# Patient Record
Sex: Male | Born: 1985 | Race: Black or African American | Hispanic: No | Marital: Single
Health system: Southern US, Community
[De-identification: ages and names within clinical notes are randomized; demographics above are authoritative.]

---
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
## 2009-01-19 ENCOUNTER — Emergency Department: Payer: Self-pay | Admitting: Internal Medicine

## 2021-01-03 ENCOUNTER — Emergency Department: Payer: Self-pay

## 2021-01-03 ENCOUNTER — Emergency Department
Admission: EM | Admit: 2021-01-03 | Discharge: 2021-01-03 | Disposition: A | Discharge status: Home / Self Care | Payer: Self-pay | Attending: Emergency Medicine | Admitting: Emergency Medicine

## 2021-01-03 ENCOUNTER — Other Ambulatory Visit: Payer: Self-pay

## 2021-01-03 DIAGNOSIS — R200 Anesthesia of skin: Secondary | ICD-10-CM | POA: Insufficient documentation

## 2021-01-03 DIAGNOSIS — S62102A Fracture of unspecified carpal bone, left wrist, initial encounter for closed fracture: Secondary | ICD-10-CM

## 2021-01-03 DIAGNOSIS — W19XXXA Unspecified fall, initial encounter: Secondary | ICD-10-CM | POA: Insufficient documentation

## 2021-01-03 DIAGNOSIS — M533 Sacrococcygeal disorders, not elsewhere classified: Secondary | ICD-10-CM | POA: Insufficient documentation

## 2021-01-03 DIAGNOSIS — S52502A Unspecified fracture of the lower end of left radius, initial encounter for closed fracture: Secondary | ICD-10-CM | POA: Insufficient documentation

## 2021-01-03 DIAGNOSIS — Y9367 Activity, basketball: Secondary | ICD-10-CM | POA: Insufficient documentation

## 2021-01-03 MED ORDER — KETAMINE HCL 50 MG/5ML IJ SOSY
PREFILLED_SYRINGE | INTRAMUSCULAR | Status: AC
  Filled 2021-01-03: qty 5

## 2021-01-03 MED ORDER — OXYCODONE-ACETAMINOPHEN 5-325 MG PO TABS: 1.0000 | ORAL_TABLET | Freq: Four times a day (QID) | ORAL | 0 refills | Status: AC | PRN

## 2021-01-03 MED ORDER — KETAMINE HCL 50 MG/5ML IJ SOSY
1.0000 mg/kg | PREFILLED_SYRINGE | Freq: Once | INTRAMUSCULAR | Status: AC
  Administered 2021-01-03: 84 mg via INTRAVENOUS
  Filled 2021-01-03: qty 10

## 2021-01-03 MED ORDER — ONDANSETRON HCL 4 MG/2ML IJ SOLN
4.0000 mg | Freq: Once | INTRAMUSCULAR | Status: AC
  Administered 2021-01-03: 4 mg via INTRAVENOUS
  Filled 2021-01-03: qty 2

## 2021-01-03 MED ORDER — CEPHALEXIN 500 MG PO CAPS: 500.0000 mg | ORAL_CAPSULE | Freq: Four times a day (QID) | ORAL | 0 refills | Status: AC

## 2021-01-03 MED ORDER — BACITRACIN-NEOMYCIN-POLYMYXIN 400-5-5000 EX OINT
TOPICAL_OINTMENT | CUTANEOUS | Status: AC
  Filled 2021-01-03: qty 2

## 2021-01-03 MED ORDER — SODIUM CHLORIDE 0.9 % IV BOLUS
1000.0000 mL | Freq: Once | INTRAVENOUS | Status: AC
  Administered 2021-01-03: 1000 mL via INTRAVENOUS

## 2021-01-03 MED ORDER — MORPHINE SULFATE (PF) 4 MG/ML IV SOLN
4.0000 mg | Freq: Once | INTRAVENOUS | Status: AC
  Administered 2021-01-03: 4 mg via INTRAVENOUS
  Filled 2021-01-03: qty 1

## 2021-01-03 MED ORDER — KETAMINE HCL 10 MG/ML IJ SOLN
INTRAMUSCULAR | Status: AC | PRN
  Administered 2021-01-03: 84.4 mg via INTRAVENOUS

## 2021-01-03 MED ORDER — CEFAZOLIN SODIUM-DEXTROSE 2-4 GM/100ML-% IV SOLN
2.0000 g | Freq: Once | INTRAVENOUS | Status: AC
  Administered 2021-01-03: 2 g via INTRAVENOUS
  Filled 2021-01-03: qty 100

## 2021-01-03 NOTE — ED Triage Notes (Signed)
Pt states he fell while playing basketball and landed on his L wrist- pt has obvious deformity- pt states he felt like he was going to pass out- pt has a small amount of blood on hand from a scrape

## 2021-01-03 NOTE — ED Provider Notes (Signed)
[facility] Emergency Department Provider Note  ____________________________________________   I have reviewed the triage vital signs and the nursing notes.   HISTORY  Chief Complaint Wrist Pain   History limited by: Not Limited   HPI Caleb Allen is a 35 y.o. male who presents to the emergency department today with left wrist pain after a fall.  Patient states that he was playing basketball when he got hip checked.  Fell backwards onto his outstretched left hand.  He did immediately feel a Manufacturing engineer.  He is complaining of significant pain in his wrist.  He is also having some slight discomfort in his tailbone area.  The patient states he does have some slight tingling to his fingertips.  Denies any other significant injury.  Denies any history of injury to that hand in the past.   Records reviewed.   History reviewed. No pertinent past medical history.  There are no problems to display for this patient.   History reviewed. No pertinent surgical history.  Prior to Admission medications   Not on File    Allergies Patient has no known allergies.  No family history on file.  Social History Plays basketball   Review of Systems Constitutional: No fever/chills Eyes: No visual changes. ENT: No sore throat. Cardiovascular: Denies chest pain. Respiratory: Denies shortness of breath. Gastrointestinal: No abdominal pain.  No nausea, no vomiting.  No diarrhea.   Genitourinary: Negative for dysuria. Musculoskeletal: Left wrist pain. Skin: Negative for rash. Neurological: Negative for headaches, focal weakness or numbness.  ____________________________________________   PHYSICAL EXAM:  VITAL SIGNS: ED Triage Vitals  Enc Vitals Group     BP 01/03/21 1822 126/86     Pulse Rate 01/03/21 1822 (!) 107     Resp 01/03/21 1822 18     Temp 01/03/21 1822 97.7 F (36.5 C)     Temp Source 01/03/21 1822 Oral     SpO2 01/03/21 1822 99 %     Weight  01/03/21 1824 186 lb (84.4 kg)     Height 01/03/21 1824 6' (1.829 m)     Head Circumference --      Peak Flow --      Pain Score 01/03/21 1824 10   Constitutional: Alert and oriented.  Eyes: Conjunctivae are normal.  ENT      Head: Normocephalic and atraumatic.      Nose: No congestion/rhinnorhea.      Mouth/Throat: Mucous membranes are moist.      Neck: No stridor. Hematological/Lymphatic/Immunilogical: No cervical lymphadenopathy. Cardiovascular: Normal rate, regular rhythm.  No murmurs, rubs, or gallops.  Respiratory: Normal respiratory effort without tachypnea nor retractions. Breath sounds are clear and equal bilaterally. No wheezes/rales/rhonchi. Gastrointestinal: Soft and non tender. No rebound. No guarding.  Genitourinary: Deferred Musculoskeletal: Left wrist with obvious deformity. Sensation intact over all finger pads. Radial pulse 2+ Neurologic:  Normal speech and language. No gross focal neurologic deficits are appreciated.  Skin:  Small punctate lesion to volar surface of wrist. Psychiatric: Mood and affect are normal. Speech and behavior are normal. Patient exhibits appropriate insight and judgment.  ____________________________________________    LABS (pertinent positives/negatives)  None  ____________________________________________   EKG  None  ____________________________________________    RADIOLOGY  Left wrist Distal radial fracture with dorsal displacement  Left wrist Post reduction with improved alignment ____________________________________________   PROCEDURES  .Sedation  Date/Time: 01/03/2021 9:43 PM Performed by: Phineas Semen, MD Authorized by: Phineas Semen, MD   Consent:    Consent obtained:  Written   Consent given by:  Patient   Risks discussed:  Allergic reaction, prolonged hypoxia resulting in organ damage, respiratory compromise necessitating ventilatory assistance and intubation, inadequate sedation, nausea and  vomiting   Alternatives discussed:  Analgesia without sedation Universal protocol:    Immediately prior to procedure, a time out was called: yes   Indications:    Procedure performed:  Fracture reduction   Procedure necessitating sedation performed by:  Physician performing sedation Pre-sedation assessment:    Time since last food or drink:  Unknown   NPO status caution: urgency dictates proceeding with non-ideal NPO status     ASA classification: class 1 - normal, healthy patient     Mouth opening:  3 or more finger widths   Mallampati score:  I - soft palate, uvula, fauces, pillars visible   Neck mobility: normal     Pre-sedation assessments completed and reviewed: airway patency, cardiovascular function, hydration status, mental status, nausea/vomiting, pain level, respiratory function and temperature   Immediate pre-procedure details:    Reviewed: vital signs   Procedure details (see MAR for exact dosages):    Preoxygenation:  Room air   Sedation:  Ketamine   Intended level of sedation: deep   Analgesia:  Morphine   Intra-procedure events: none     Total Provider sedation time (minutes):  25 Reduction of fracture  Date/Time: 01/03/2021 9:45 PM Performed by: Phineas Semen, MD Authorized by: Phineas Semen, MD  Consent: Verbal consent obtained. Written consent obtained. Consent given by: patient Patient understanding: patient states understanding of the procedure being performed Patient consent: the patient's understanding of the procedure matches consent given Procedure consent: procedure consent matches procedure scheduled Relevant documents: relevant documents present and verified Patient identity confirmed: verbally with patient and arm band Time out: Immediately prior to procedure a "time out" was called to verify the correct patient, procedure, equipment, support staff and site/side marked as required. Local anesthesia used: no  Anesthesia: Local anesthesia used:  no  Sedation: Patient sedated: yes Sedatives: ketamine        ____________________________________________   INITIAL IMPRESSION / ASSESSMENT AND PLAN / ED COURSE  Pertinent labs & imaging results that were available during my care of the patient were reviewed by me and considered in my medical decision making (see chart for details).   Patient presented to the emergency department today with concerns for left wrist pain after a fall while playing basketball.  Obvious deformity on exam.  Neurovascularly intact distally.  X-ray does confirm radial fracture.  Reduction was attempted here in the emergency department under sedation.  While there was improved alignment still not true anatomical position.  Dr. Martha Clan with orthopedics did review both pre and postreduction films.  He did think patient would benefit from surgery.  I had a long discussion with the patient about surgery.  Patient did not want surgery at this time.  We did discuss possible poor functional outcome and chronic pain.  I did try to answer any questions patient had about possible surgical procedure however patient again stated he did not have any interest in surgery at this time.  Because of this we will plan on discharging home.  I also discussed small puncture wound with Dr. Martha Clan.  Patient was given dose of IV antibiotics here and will be started on oral antibiotics.  I discussed infection return precautions with patient.  ____________________________________________   FINAL CLINICAL IMPRESSION(S) / ED DIAGNOSES  Final diagnoses:  Closed fracture of left wrist, initial encounter  Note: This dictation was prepared with Dragon dictation. Any transcriptional errors that result from this process are unintentional     Phineas Semen, MD 01/03/21 [phone]

## 2021-01-03 NOTE — Discharge Instructions (Addendum)
Please seek medical attention for any high fevers, concerning discharge from the wrist, worsening pain or redness or any other new or concerning symptoms. It is very important you follow up with orthopedics.

## 2021-01-04 NOTE — Consult Note (Signed)
I was contacted last night by Dr. Derrill Kay in the ER regarding this 35 year old male who fell playing basketball onto his left wrist and sustained a displaced left distal radius fracture.  Dr. Derrill Kay reported the patient had a small poke hole at the volar aspect of the left wrist.  He reports the patient has mild tingling in his fingers but no other neurologic deficits.  I recommended IV Kefzol and attempted close reduction.  I recommended the small wound to be irrigated.  Dr. Derrill Kay performed a closed reduction in the ER and applied a splint.  I reviewed the post reduction x-rays which showed improvement in the dorsal angulation on the lateral view and improvement in the radial height on the AP view.  However, there was still residual dorsal displacement of the distal fragment.  Therefore I told Dr. Derrill Kay to relay to the patient that I would recommend he undergo open reduction internal fixation given his young age and high activity level and offered to admit him to the hospital overnight for surgery in the morning.  Dr. Derrill Kay states the patient did not want surgery.  Instead he will follow-up in the office within 1 week for reevaluation and x-ray.  I recommended the patient be discharged on oral antibiotics until follow-up.

## 2023-02-24 IMAGING — DX DG WRIST COMPLETE 3+V*L*
3 series · 3 of 3 positions shown · non-contrast
Comparison: None.

CLINICAL DATA: Fall playing basketball, landed on left wrist.
Deformity.

EXAM:
LEFT WRIST - COMPLETE 3+ VIEW

[wrist obl]
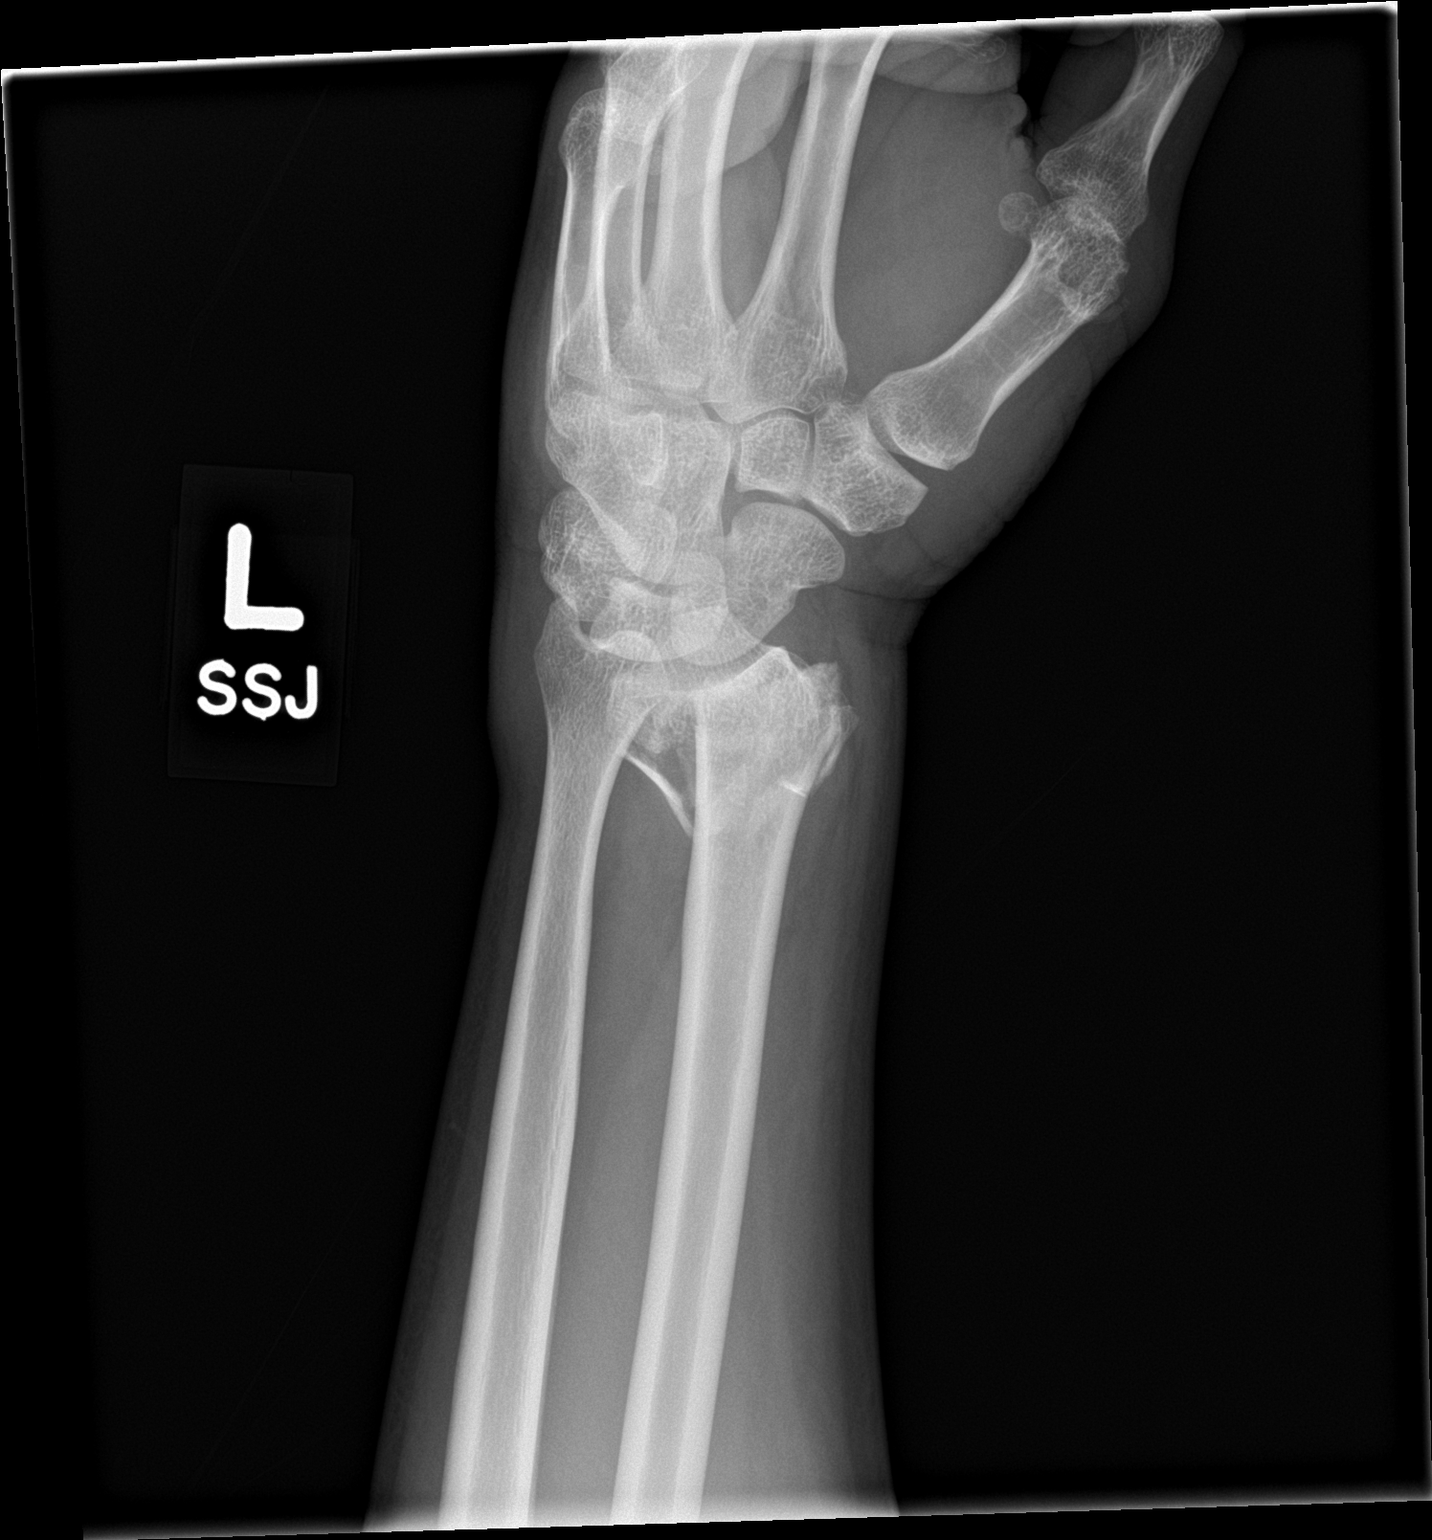

[wrist ap]
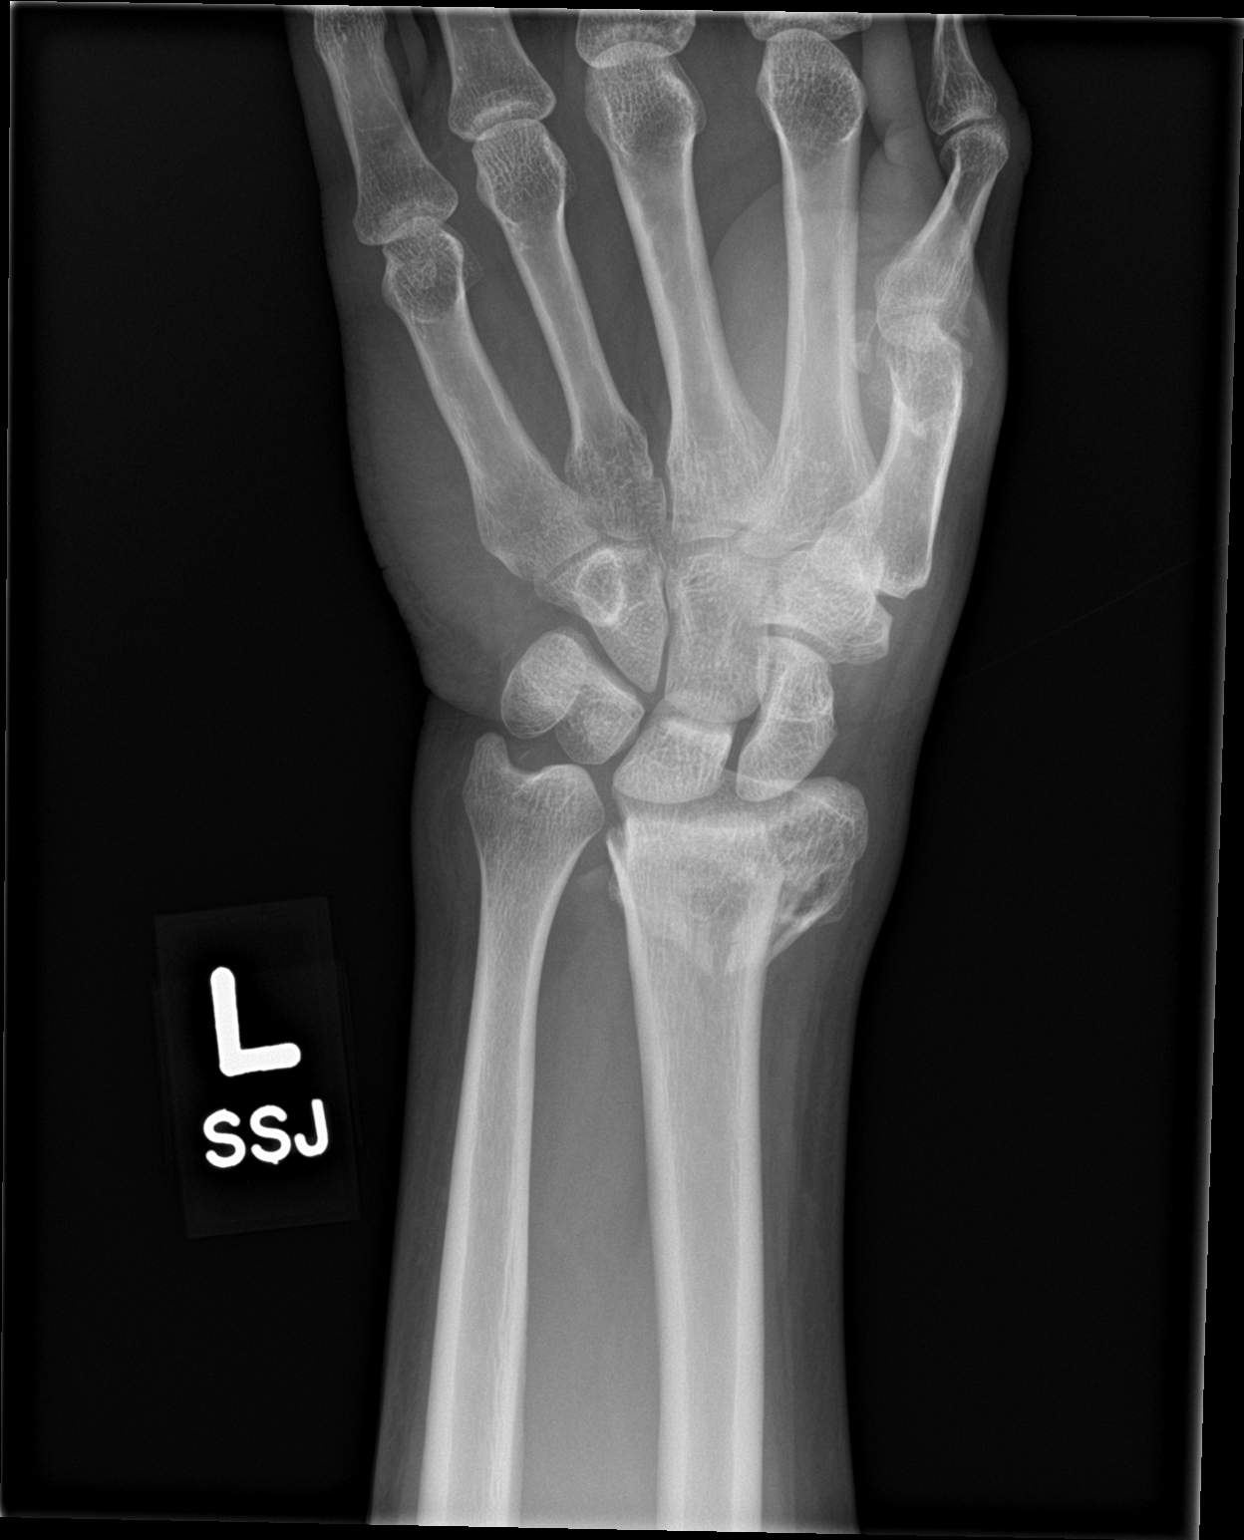

[wrist lat]
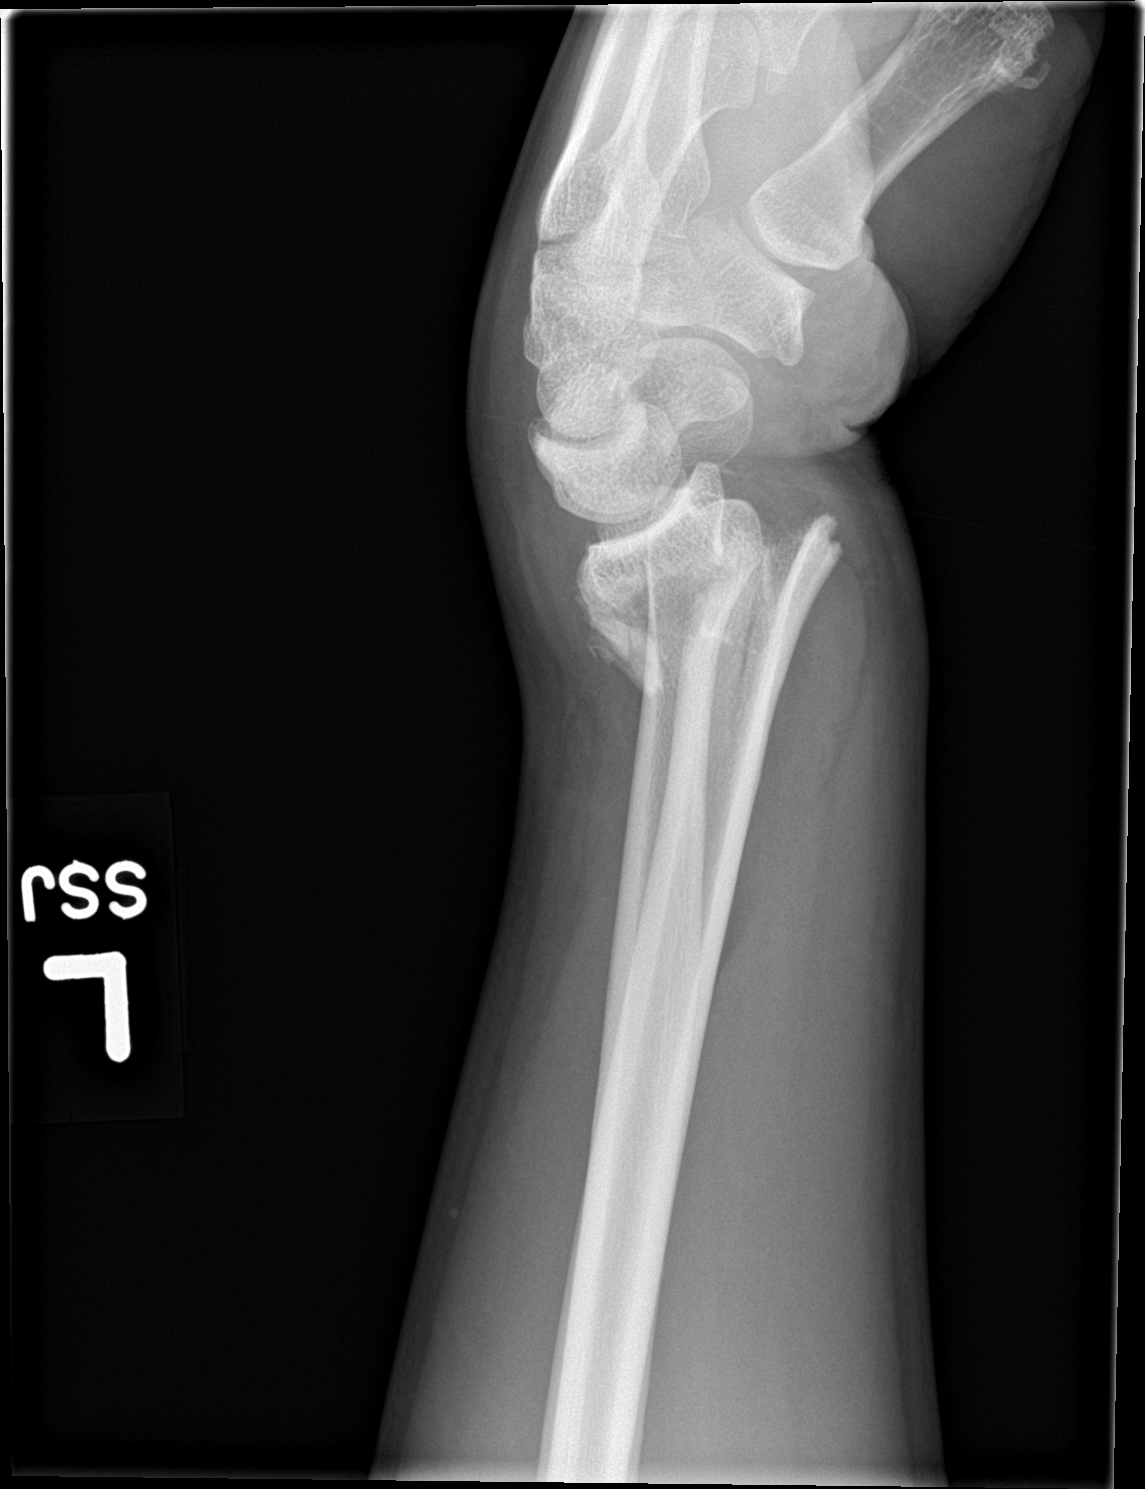

[3 of 3 positions shown; findings below may reference images not displayed]

FINDINGS: There is a comminuted distal left radial fracture. Posterior
displacement and angulation of the distal fragments. Overlapping
fragments. No ulnar abnormality. No subluxation or dislocation.
IMPRESSION: Comminuted, posteriorly displaced and angulated distal left radial
fracture.

## 2023-02-24 IMAGING — DX DG WRIST COMPLETE 3+V*L*
3 series · 3 of 3 positions shown · non-contrast
Comparison: 01/03/2021

CLINICAL DATA: Postreduction

EXAM:
LEFT WRIST - COMPLETE 3+ VIEW

[wrist ap]
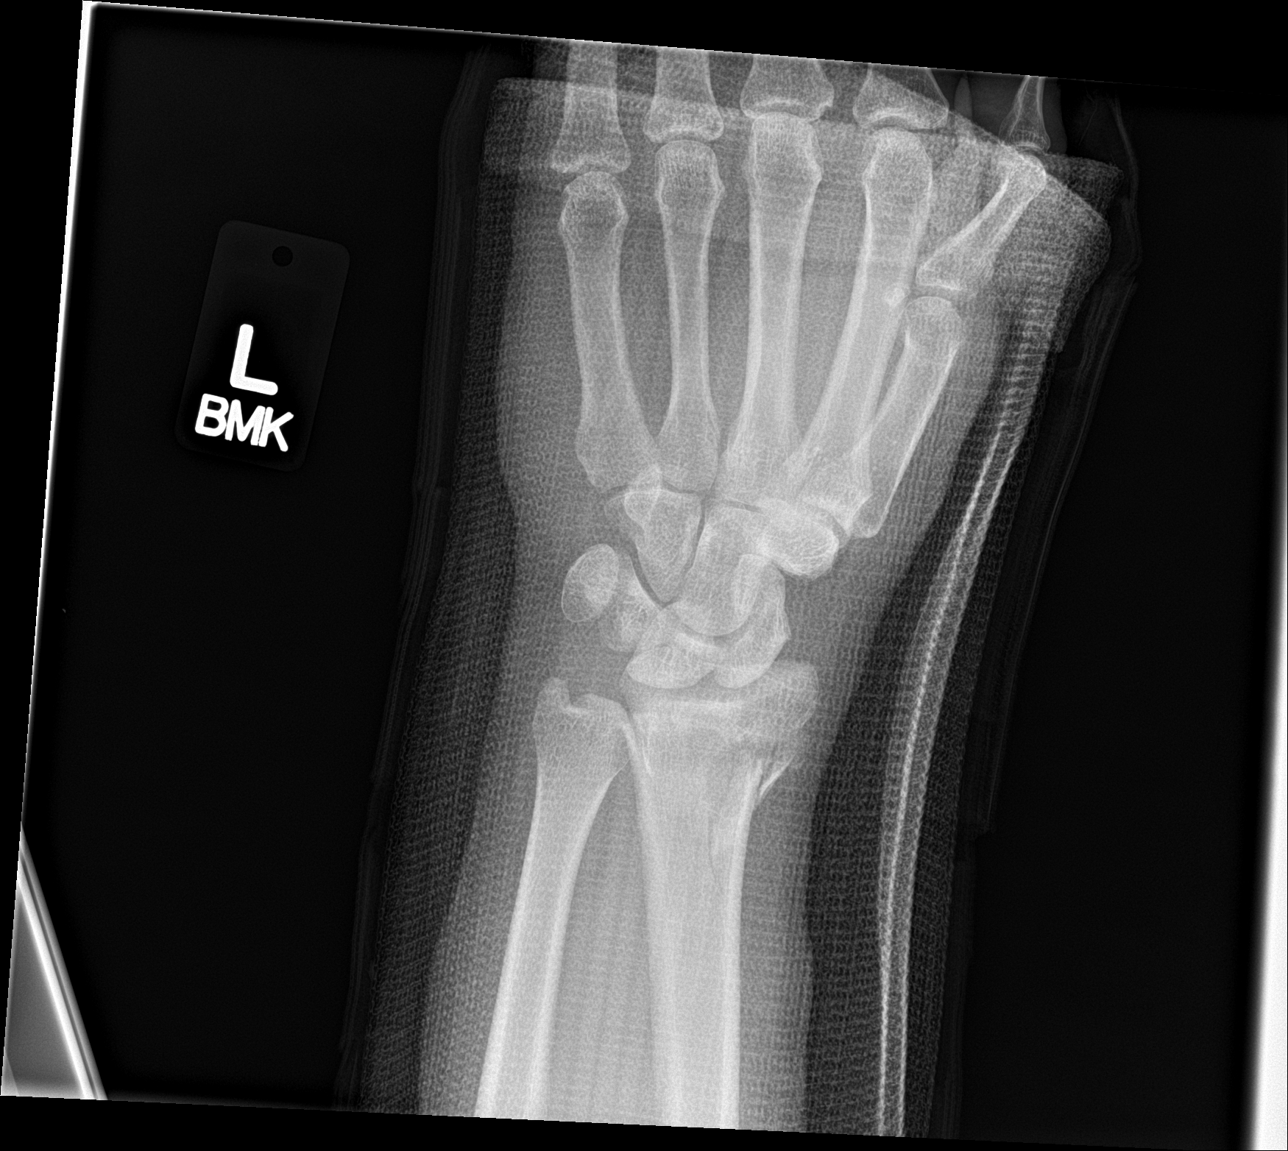

[wrist obl]
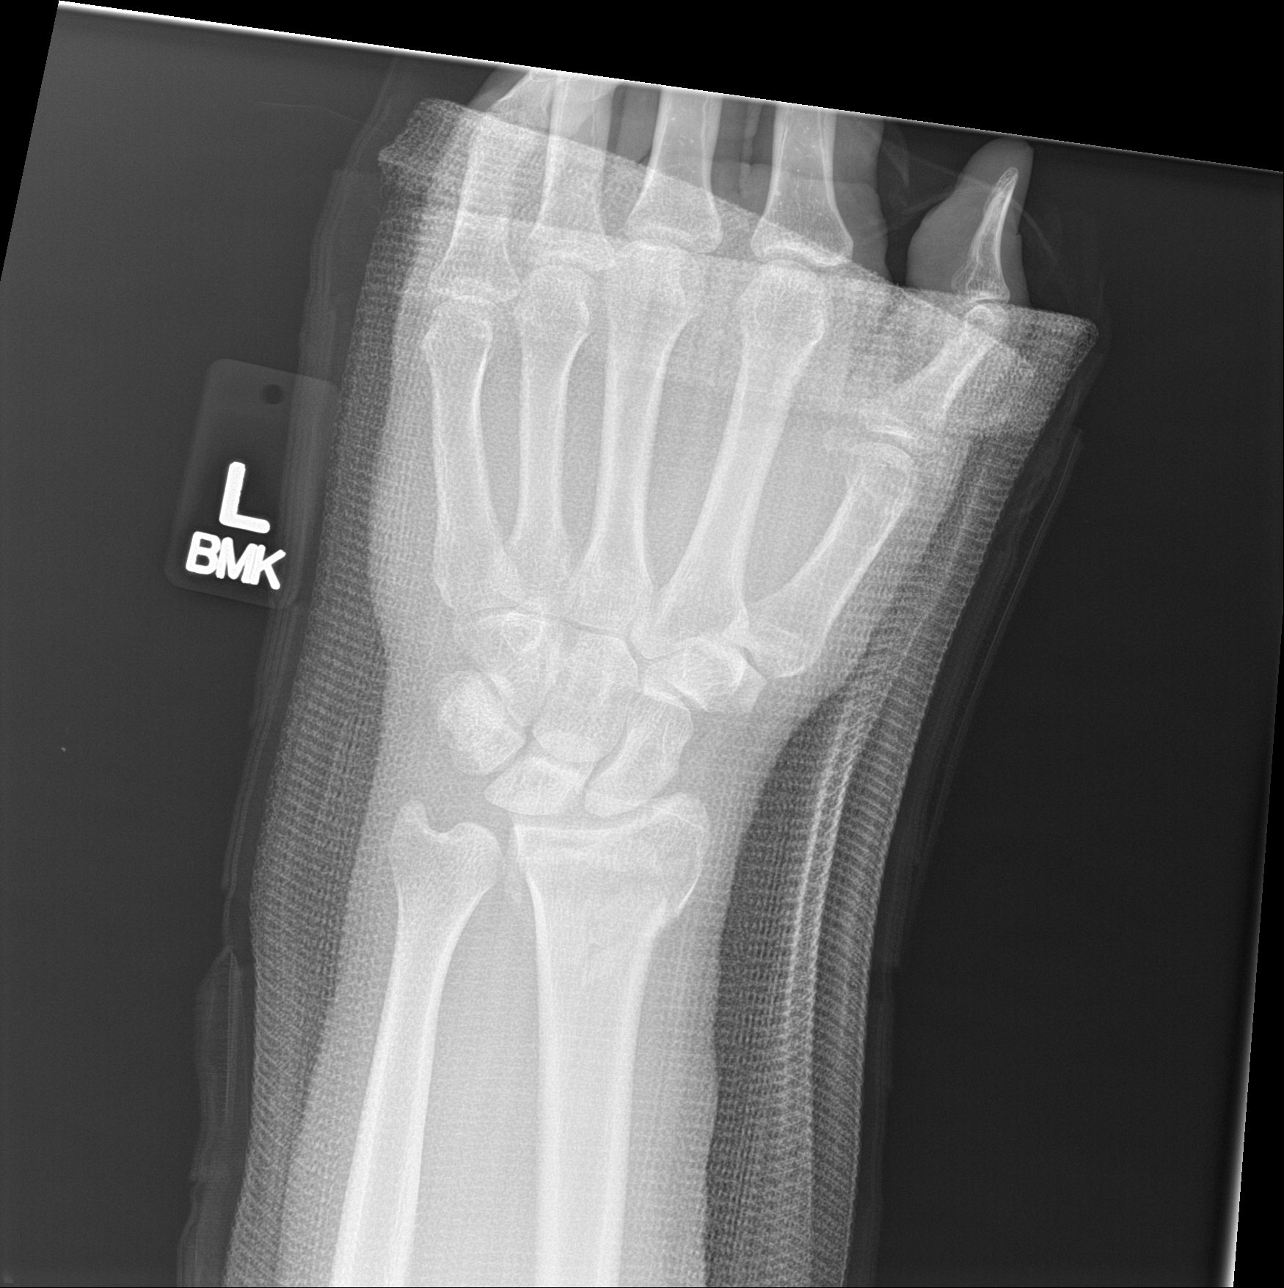

[wrist lat]
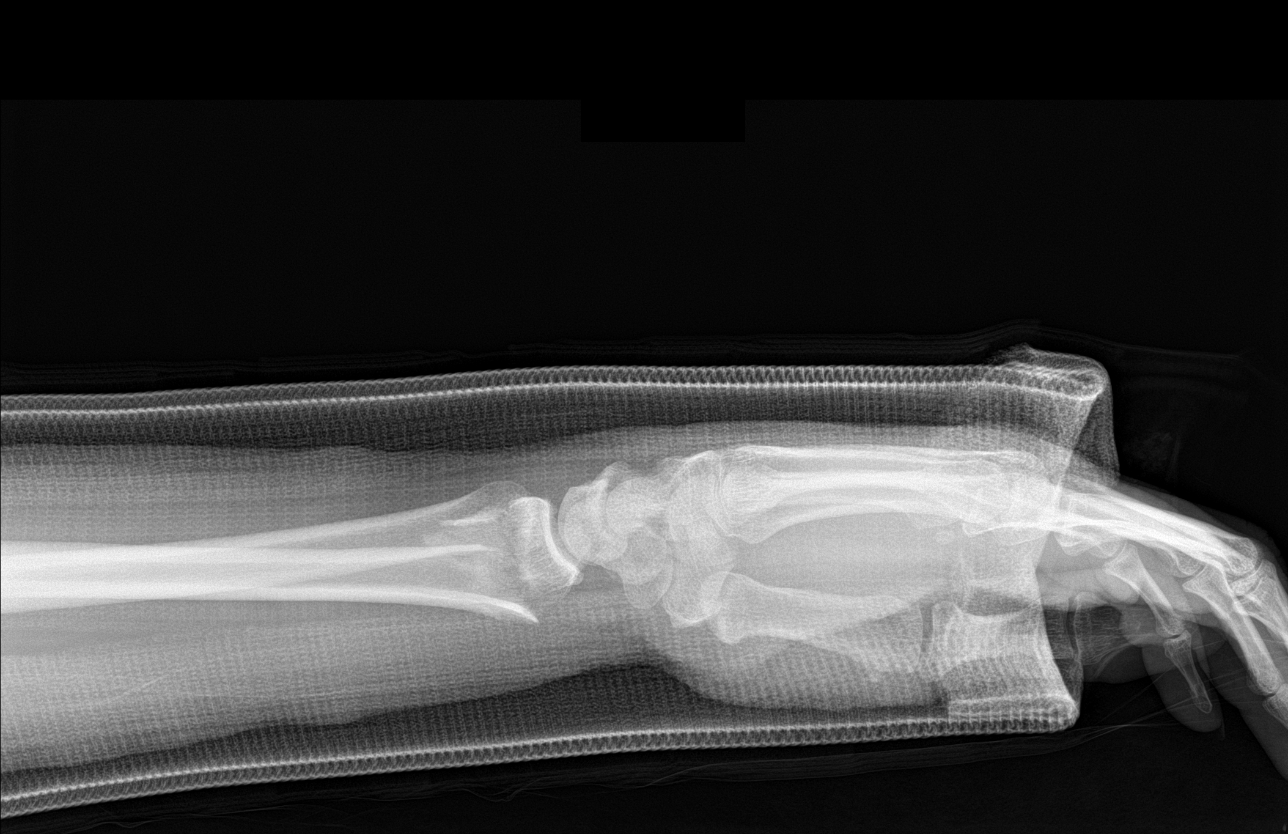

[3 of 3 positions shown; findings below may reference images not displayed]

FINDINGS: In cast views of the left wrist demonstrate improved alignment of
the distal left radial fracture. Continued mild posterior angulation
and displacement.
IMPRESSION: Improved alignment. Continued mild posterior angulation and
displacement.

## 2023-06-12 ENCOUNTER — Other Ambulatory Visit (HOSPITAL_COMMUNITY): Payer: Self-pay
